# Patient Record
Sex: Female | Born: 1980 | Race: Black or African American | Hispanic: No | Marital: Single | Smoking: Never smoker
Health system: Southern US, Community
[De-identification: ages and names within clinical notes are randomized; demographics above are authoritative.]

---

## 2015-02-24 ENCOUNTER — Emergency Department (INDEPENDENT_AMBULATORY_CARE_PROVIDER_SITE_OTHER)
Admission: EM | Admit: 2015-02-24 | Discharge: 2015-02-24 | Disposition: A | Discharge status: Home / Self Care | Payer: Managed Care, Other (non HMO) | Source: Home / Self Care | Attending: Emergency Medicine | Admitting: Emergency Medicine

## 2015-02-24 ENCOUNTER — Emergency Department (INDEPENDENT_AMBULATORY_CARE_PROVIDER_SITE_OTHER): Payer: Managed Care, Other (non HMO)

## 2015-02-24 ENCOUNTER — Encounter (HOSPITAL_COMMUNITY): Payer: Self-pay | Admitting: Emergency Medicine

## 2015-02-24 DIAGNOSIS — S93601A Unspecified sprain of right foot, initial encounter: Secondary | ICD-10-CM | POA: Diagnosis not present

## 2015-02-24 NOTE — ED Notes (Signed)
Pt was walking down a flight of stairs, thought she was on the last step, and injured her right foot last night.  She did not fall, but she landed on her toes and jammed the foot into the ground.  She states it does not hurt to stand on it, but she has pain with ambulation on the top of her foot.  Some mild swelling occurred.  She has been elevating the foot and putting ice on it.

## 2015-02-24 NOTE — ED Provider Notes (Signed)
CSN: [id number]     Arrival date & time 02/24/15  1311 History   First MD Initiated Contact with Patient 02/24/15 1419     Chief Complaint  Patient presents with  . Foot Injury    right   (Consider location/radiation/quality/duration/timing/severity/associated sxs/prior Treatment) HPI  She is a 34 year old woman here for evaluation of right foot injury. She states that last night she was coming down a flight of stairs and missed the last step. She states she landed on 8.8 foot, jamming her toes. She denies rolling the ankle at all. She has applied ice and elevated the foot, which has helped. She reports pain and swelling in the dorsal lateral foot. She is able to walk, but with slight discomfort. She also has some discomfort with wiggling her toes.  History reviewed. No pertinent past medical history. Past Surgical History  Procedure Laterality Date  . Cesarean section  2008   History reviewed. No pertinent family history. Social History  Substance Use Topics  . Smoking status: Never Smoker   . Smokeless tobacco: None  . Alcohol Use: Yes     Comment: occasional   OB History    No data available     Review of Systems As in history of present illness Allergies  Orange fruit  Home Medications   Prior to Admission medications   Not on File   Meds Ordered and Administered this Visit  Medications - No data to display  BP 138/80 mmHg  Pulse 93  Temp(Src) 98.6 F (37 C) (Oral)  SpO2 100%  LMP 02/08/2015 (Exact Date) No data found.   Physical Exam  Constitutional: She is oriented to person, place, and time. She appears well-developed and well-nourished. No distress.  Cardiovascular: Normal rate.   Pulmonary/Chest: Effort normal.  Musculoskeletal:       Feet:  Right foot: 2+ DP pulse. She does have mild swelling over the dorsal lateral foot. She is tender over this area. No malleolar or ankle tenderness. Brisk cap refill. She is able to wiggle her toes, but this does  cause some discomfort.  Neurological: She is alert and oriented to person, place, and time.    ED Course  Procedures (including critical care time)  Labs Review Labs Reviewed - No data to display  Imaging Review Dg Foot Complete Right  02/24/2015  CLINICAL DATA:  35 year old with lateral foot pain after falling and twisting ankle. EXAM: RIGHT FOOT COMPLETE - 3+ VIEW COMPARISON:  None. FINDINGS: The mineralization and alignment are normal. There is no evidence of acute fracture or dislocation. The joint spaces are maintained. The alignment is normal at the Lisfranc joint. No focal soft tissue swelling identified. IMPRESSION: No evidence of acute right foot injury. Electronically Signed   By: Carey Bullocks M.D.   On: 02/24/2015 14:51      MDM   1. Foot sprain, right, initial encounter    Symptomatic treatment with rest, ice, elevation. Tylenol or ibuprofen as needed. Follow-up as needed.  I have verbally reviewed the discharge instructions with the patient. A printed AVS was given to the patient.  All questions were answered prior to discharge.      Charm Rings, MD 02/24/15 [phone]

## 2015-02-24 NOTE — Discharge Instructions (Signed)
You have sprained some tendons in your foot. Apply ice and elevate your foot as much as you can. Take Tylenol or ibuprofen as needed for pain. This will slowly improve over the next week or so. Follow-up as needed.

## 2016-12-20 IMAGING — DX DG FOOT COMPLETE 3+V*R*
3 series · 3 of 3 positions shown · non-contrast
Comparison: None.

CLINICAL DATA: 34-year-old with lateral foot pain after falling and
twisting ankle.

EXAM:
RIGHT FOOT COMPLETE - 3+ VIEW

[foot ap]
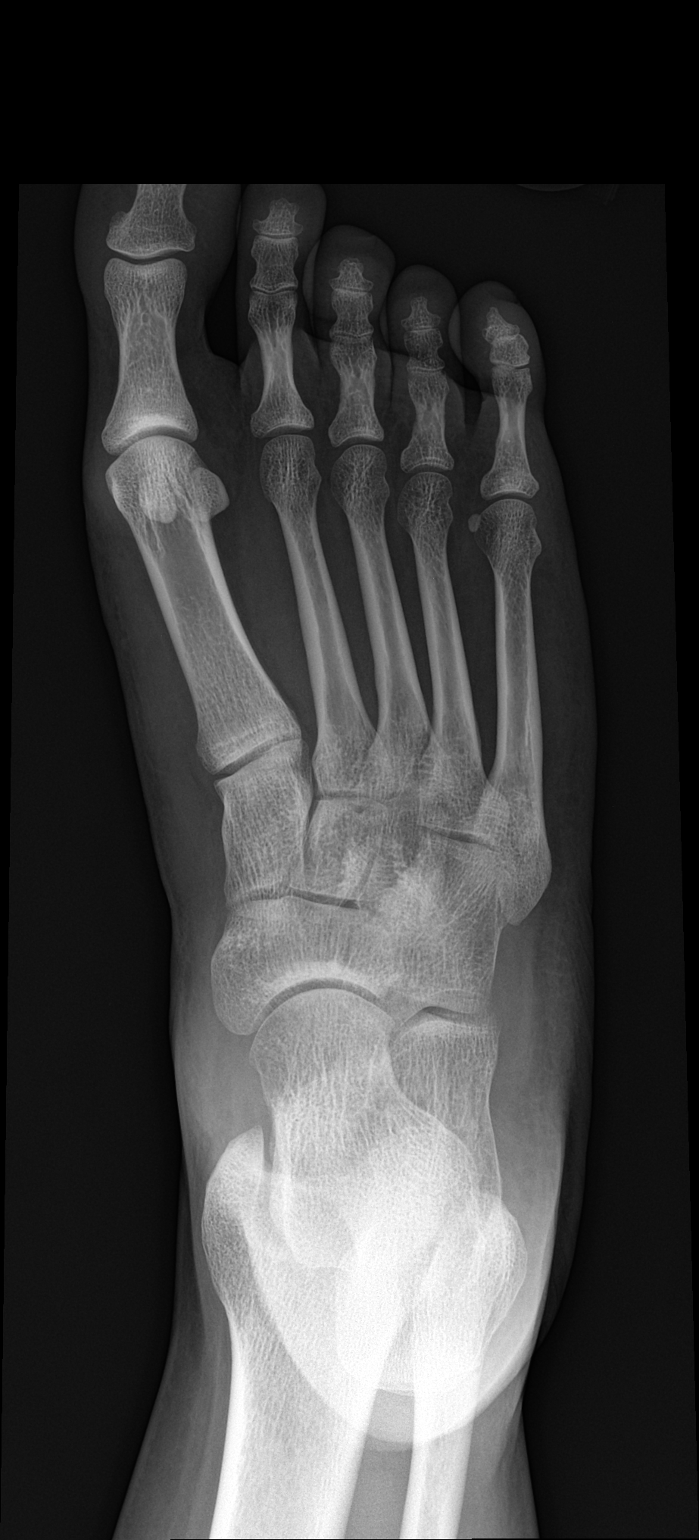

[foot obl]
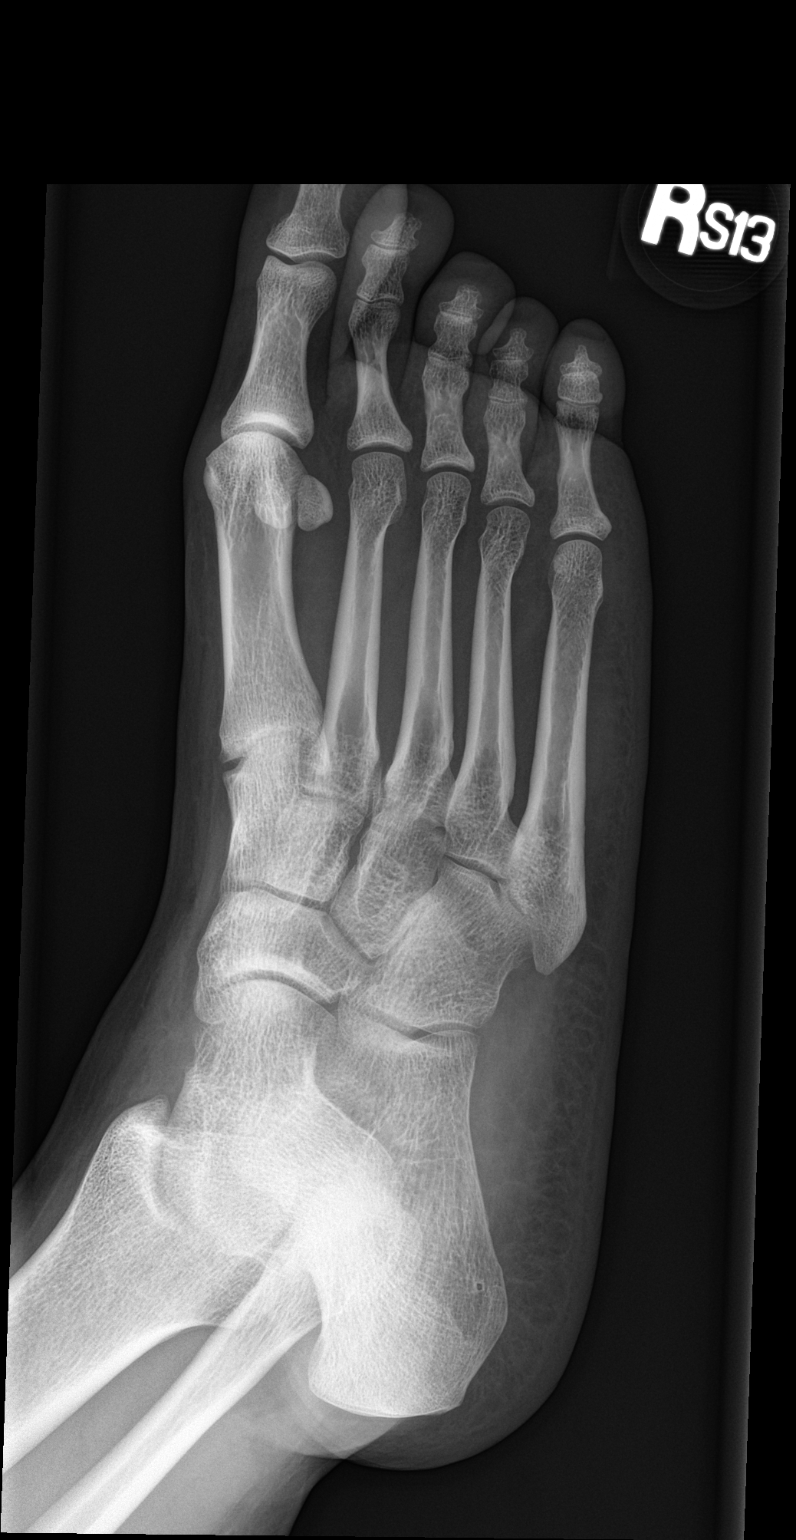

[foot lat]
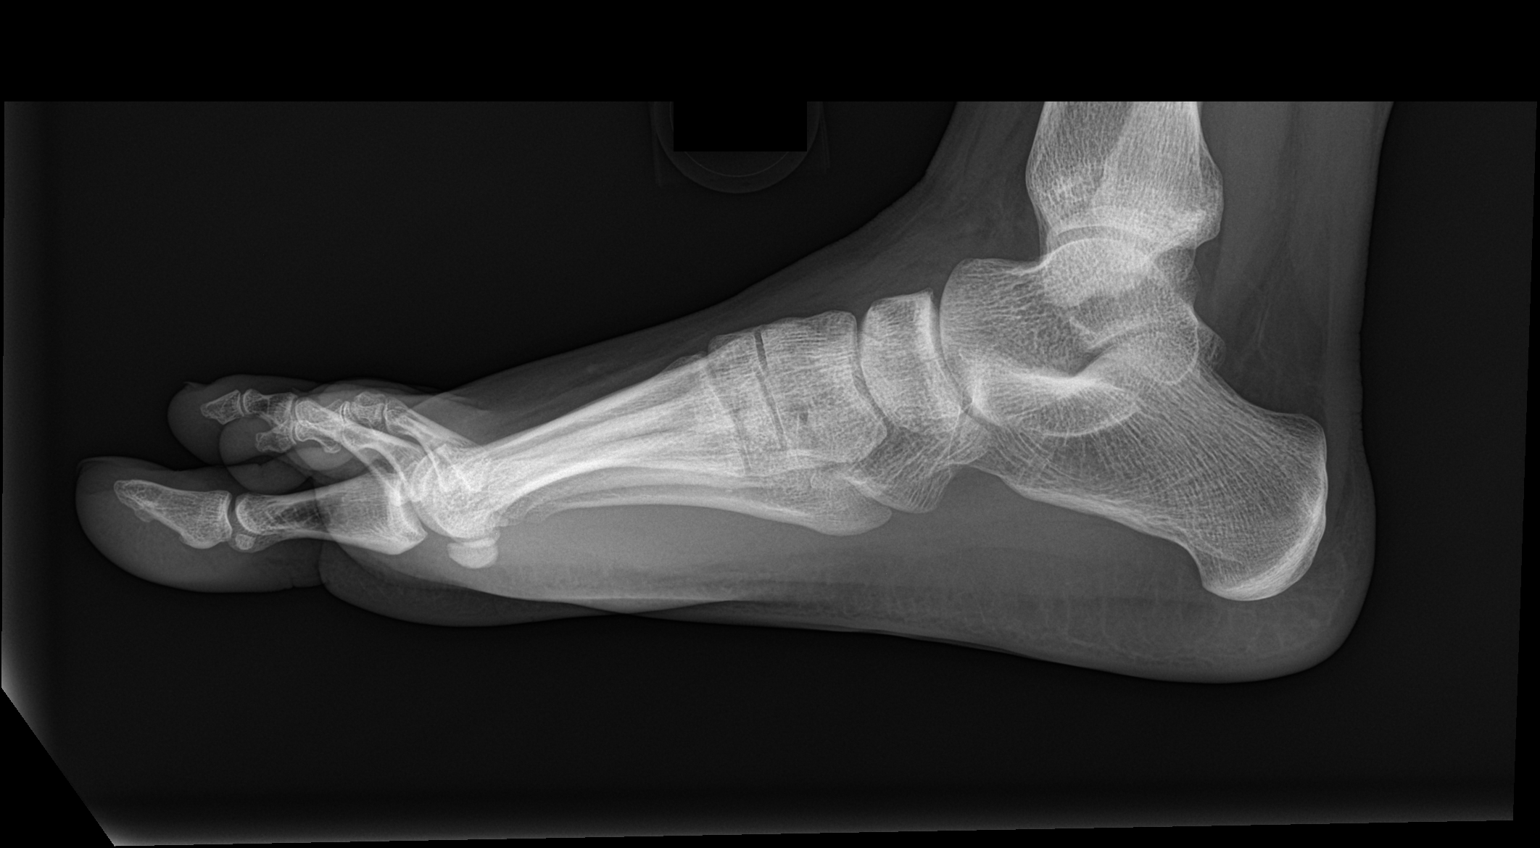

[3 of 3 positions shown; findings below may reference images not displayed]

FINDINGS: The mineralization and alignment are normal. There is no evidence of
acute fracture or dislocation. The joint spaces are maintained. The
alignment is normal at the Lisfranc joint. No focal soft tissue
swelling identified.
IMPRESSION: No evidence of acute right foot injury.
